# Patient Record
Sex: Male | Born: 1990 | Race: White | Hispanic: No | Marital: Single | Smoking: Current every day smoker
Health system: Southern US, Community
[De-identification: ages and names within clinical notes are randomized; demographics above are authoritative.]

## PROBLEM LIST (undated history)

## (undated) DIAGNOSIS — E119 Type 2 diabetes mellitus without complications: Secondary | ICD-10-CM

## (undated) DIAGNOSIS — F909 Attention-deficit hyperactivity disorder, unspecified type: Secondary | ICD-10-CM

---

## 2019-02-22 ENCOUNTER — Emergency Department (HOSPITAL_COMMUNITY): Payer: Self-pay

## 2019-02-22 ENCOUNTER — Emergency Department (HOSPITAL_COMMUNITY)
Admission: EM | Admit: 2019-02-22 | Discharge: 2019-02-23 | Disposition: A | Discharge status: Home / Self Care | Payer: Self-pay | Attending: Emergency Medicine | Admitting: Emergency Medicine

## 2019-02-22 ENCOUNTER — Encounter (HOSPITAL_COMMUNITY): Payer: Self-pay

## 2019-02-22 DIAGNOSIS — Y908 Blood alcohol level of 240 mg/100 ml or more: Secondary | ICD-10-CM | POA: Insufficient documentation

## 2019-02-22 DIAGNOSIS — R51 Headache: Secondary | ICD-10-CM | POA: Insufficient documentation

## 2019-02-22 DIAGNOSIS — F10929 Alcohol use, unspecified with intoxication, unspecified: Secondary | ICD-10-CM | POA: Insufficient documentation

## 2019-02-22 DIAGNOSIS — Y999 Unspecified external cause status: Secondary | ICD-10-CM | POA: Insufficient documentation

## 2019-02-22 DIAGNOSIS — R000 Tachycardia, unspecified: Secondary | ICD-10-CM | POA: Insufficient documentation

## 2019-02-22 DIAGNOSIS — Y92410 Unspecified street and highway as the place of occurrence of the external cause: Secondary | ICD-10-CM | POA: Insufficient documentation

## 2019-02-22 DIAGNOSIS — Y93I9 Activity, other involving external motion: Secondary | ICD-10-CM | POA: Insufficient documentation

## 2019-02-22 DIAGNOSIS — S66912A Strain of unspecified muscle, fascia and tendon at wrist and hand level, left hand, initial encounter: Secondary | ICD-10-CM | POA: Insufficient documentation

## 2019-02-22 HISTORY — DX: Attention-deficit hyperactivity disorder, unspecified type: F90.9

## 2019-02-22 HISTORY — DX: Type 2 diabetes mellitus without complications: E11.9

## 2019-02-22 LAB — I-STAT CHEM 8, ED
BUN: 9 mg/dL (ref 6–20)
Calcium, Ion: 1.07 mmol/L — ABNORMAL LOW (ref 1.15–1.40)
Chloride: 103 mmol/L (ref 98–111)
Creatinine, Ser: 1.1 mg/dL (ref 0.61–1.24)
Glucose, Bld: 113 mg/dL — ABNORMAL HIGH (ref 70–99)
HCT: 50 % (ref 39.0–52.0)
Hemoglobin: 17 g/dL (ref 13.0–17.0)
Potassium: 3.8 mmol/L (ref 3.5–5.1)
Sodium: 140 mmol/L (ref 135–145)
TCO2: 26 mmol/L (ref 22–32)

## 2019-02-22 LAB — PROTIME-INR
INR: 0.8 (ref 0.8–1.2)
Prothrombin Time: 11.2 seconds — ABNORMAL LOW (ref 11.4–15.2)

## 2019-02-22 LAB — CBG MONITORING, ED: Glucose-Capillary: 104 mg/dL — ABNORMAL HIGH (ref 70–99)

## 2019-02-22 LAB — SAMPLE TO BLOOD BANK

## 2019-02-22 LAB — LACTIC ACID, PLASMA: Lactic Acid, Venous: 2.1 mmol/L (ref 0.5–1.9)

## 2019-02-22 MED ORDER — SODIUM CHLORIDE 0.9 % IV BOLUS
1000.0000 mL | Freq: Once | INTRAVENOUS | Status: AC
  Administered 2019-02-22: 1000 mL via INTRAVENOUS

## 2019-02-22 MED ORDER — SODIUM CHLORIDE 0.9 % IV SOLN: INTRAVENOUS | Status: DC

## 2019-02-22 MED ORDER — IOHEXOL 300 MG/ML  SOLN
100.0000 mL | Freq: Once | INTRAMUSCULAR | Status: AC | PRN
  Administered 2019-02-22: 100 mL via INTRAVENOUS

## 2019-02-22 NOTE — ED Notes (Signed)
Patient transported to CT 
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
## 2019-02-23 LAB — COMPREHENSIVE METABOLIC PANEL
ALT: 37 U/L (ref 0–44)
AST: 47 U/L — ABNORMAL HIGH (ref 15–41)
Albumin: 4.4 g/dL (ref 3.5–5.0)
Alkaline Phosphatase: 109 U/L (ref 38–126)
Anion gap: 17 — ABNORMAL HIGH (ref 5–15)
BUN: 7 mg/dL (ref 6–20)
CO2: 24 mmol/L (ref 22–32)
Calcium: 9.5 mg/dL (ref 8.9–10.3)
Chloride: 101 mmol/L (ref 98–111)
Creatinine, Ser: 0.86 mg/dL (ref 0.61–1.24)
GFR calc Af Amer: >60 mL/min (ref >60)
GFR calc non Af Amer: >60 mL/min (ref >60)
Glucose, Bld: 116 mg/dL — ABNORMAL HIGH (ref 70–99)
Potassium: 3.8 mmol/L (ref 3.5–5.1)
Sodium: 142 mmol/L (ref 135–145)
Total Bilirubin: 0.5 mg/dL (ref 0.3–1.2)
Total Protein: 7.2 g/dL (ref 6.5–8.1)

## 2019-02-23 LAB — CBC
HCT: 46.7 % (ref 39.0–52.0)
Hemoglobin: 16.1 g/dL (ref 13.0–17.0)
MCH: 33.8 pg (ref 26.0–34.0)
MCHC: 34.5 g/dL (ref 30.0–36.0)
MCV: 98.1 fL (ref 80.0–100.0)
Platelets: 317 10*3/uL (ref 150–400)
RBC: 4.76 MIL/uL (ref 4.22–5.81)
RDW: 12.9 % (ref 11.5–15.5)
WBC: 6 10*3/uL (ref 4.0–10.5)
nRBC: 0 % (ref 0.0–0.2)

## 2019-02-23 LAB — CDS SEROLOGY

## 2019-02-23 LAB — ETHANOL: Alcohol, Ethyl (B): 279 mg/dL — ABNORMAL HIGH (ref <10)

## 2019-02-23 MED ORDER — IBUPROFEN 600 MG PO TABS: 600.0000 mg | ORAL_TABLET | Freq: Four times a day (QID) | ORAL | 0 refills | Status: AC | PRN

## 2019-02-23 MED ORDER — CYCLOBENZAPRINE HCL 10 MG PO TABS: 10.0000 mg | ORAL_TABLET | Freq: Two times a day (BID) | ORAL | 0 refills | Status: DC | PRN

## 2019-02-23 MED ORDER — IBUPROFEN 600 MG PO TABS: 600.0000 mg | ORAL_TABLET | Freq: Four times a day (QID) | ORAL | 0 refills | Status: DC | PRN

## 2019-02-23 MED ORDER — SODIUM CHLORIDE 0.9 % IV BOLUS
1000.0000 mL | Freq: Once | INTRAVENOUS | Status: AC
  Administered 2019-02-23: 01:00:00 1000 mL via INTRAVENOUS

## 2019-02-23 MED ORDER — CYCLOBENZAPRINE HCL 10 MG PO TABS: 10.0000 mg | ORAL_TABLET | Freq: Two times a day (BID) | ORAL | 0 refills | Status: AC | PRN
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
